# Patient Record
Sex: Female | Born: 2005 | Race: White | Hispanic: Yes | Marital: Single | Smoking: Never smoker
Health system: Southern US, Community
[De-identification: ages and names within clinical notes are randomized; demographics above are authoritative.]

## PROBLEM LIST (undated history)

## (undated) DIAGNOSIS — R740 Nonspecific elevation of levels of transaminase and lactic acid dehydrogenase [LDH]: Secondary | ICD-10-CM

## (undated) HISTORY — DX: Nonspecific elevation of levels of transaminase and lactic acid dehydrogenase (ldh): R74.0

---
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
## 2024-04-10 ENCOUNTER — Emergency Department (HOSPITAL_COMMUNITY)
Admission: EM | Admit: 2024-04-10 | Discharge: 2024-04-10 | Disposition: A | Discharge status: Home / Self Care | Attending: Emergency Medicine | Admitting: Emergency Medicine

## 2024-04-10 ENCOUNTER — Other Ambulatory Visit: Payer: Self-pay

## 2024-04-10 ENCOUNTER — Encounter (HOSPITAL_COMMUNITY): Payer: Self-pay

## 2024-04-10 DIAGNOSIS — N939 Abnormal uterine and vaginal bleeding, unspecified: Secondary | ICD-10-CM

## 2024-04-10 DIAGNOSIS — R000 Tachycardia, unspecified: Secondary | ICD-10-CM | POA: Diagnosis not present

## 2024-04-10 DIAGNOSIS — S3141XA Laceration without foreign body of vagina and vulva, initial encounter: Secondary | ICD-10-CM | POA: Insufficient documentation

## 2024-04-10 DIAGNOSIS — X58XXXA Exposure to other specified factors, initial encounter: Secondary | ICD-10-CM | POA: Insufficient documentation

## 2024-04-10 LAB — BASIC METABOLIC PANEL WITH GFR
Anion gap: 14 (ref 5–15)
BUN: 10 mg/dL (ref 6–20)
CO2: 22 mmol/L (ref 22–32)
Calcium: 9.9 mg/dL (ref 8.9–10.3)
Chloride: 103 mmol/L (ref 98–111)
Creatinine, Ser: 0.8 mg/dL (ref 0.44–1.00)
GFR, Estimated: >60 mL/min (ref >60)
Glucose, Bld: 130 mg/dL — ABNORMAL HIGH (ref 70–99)
Potassium: 3.3 mmol/L — ABNORMAL LOW (ref 3.5–5.1)
Sodium: 139 mmol/L (ref 135–145)

## 2024-04-10 LAB — CBC WITH DIFFERENTIAL/PLATELET
Abs Immature Granulocytes: 0.06 K/uL (ref 0.00–0.07)
Basophils Absolute: 0.1 K/uL (ref 0.0–0.1)
Basophils Relative: 1 %
Eosinophils Absolute: 0 K/uL (ref 0.0–0.5)
Eosinophils Relative: 0 %
HCT: 38.7 % (ref 36.0–46.0)
Hemoglobin: 12.2 g/dL (ref 12.0–15.0)
Immature Granulocytes: 1 %
Lymphocytes Relative: 19 %
Lymphs Abs: 2.2 K/uL (ref 0.7–4.0)
MCH: 25.9 pg — ABNORMAL LOW (ref 26.0–34.0)
MCHC: 31.5 g/dL (ref 30.0–36.0)
MCV: 82.2 fL (ref 80.0–100.0)
Monocytes Absolute: 0.9 K/uL (ref 0.1–1.0)
Monocytes Relative: 8 %
Neutro Abs: 8.4 K/uL — ABNORMAL HIGH (ref 1.7–7.7)
Neutrophils Relative %: 71 %
Platelets: 515 K/uL — ABNORMAL HIGH (ref 150–400)
RBC: 4.71 MIL/uL (ref 3.87–5.11)
RDW: 13.5 % (ref 11.5–15.5)
WBC: 11.6 K/uL — ABNORMAL HIGH (ref 4.0–10.5)
nRBC: 0 % (ref 0.0–0.2)

## 2024-04-10 LAB — HCG, SERUM, QUALITATIVE: Preg, Serum: NEGATIVE

## 2024-04-10 MED ORDER — SODIUM CHLORIDE 0.9 % IV BOLUS
500.0000 mL | Freq: Once | INTRAVENOUS | Status: AC
  Administered 2024-04-10: 500 mL via INTRAVENOUS

## 2024-04-10 MED ORDER — TRANEXAMIC ACID-NACL 1000-0.7 MG/100ML-% IV SOLN
1000.0000 mg | INTRAVENOUS | Status: AC
  Administered 2024-04-10: 1000 mg via INTRAVENOUS
  Filled 2024-04-10: qty 100

## 2024-04-10 MED ORDER — TRANEXAMIC ACID FOR EPISTAXIS
500.0000 mg | Freq: Once | TOPICAL | Status: DC
  Filled 2024-04-10: qty 10

## 2024-04-10 NOTE — ED Triage Notes (Signed)
 Pt reports sexual intercourse last night around 1am and has had vaginal bleeding ever since. Pt reports she was bleeding a lot when it first started and has been bleeding today as well. Pt reports having to put pressure on it  to stop the bleeding. Pt is unsure if the bleeding is internal or external. Pt reports intermittent dizziness today.

## 2024-04-10 NOTE — Discharge Instructions (Signed)
 You were seen for vaginal bleeding in the emergency department.   At home, please hold pressure because we have given you if you have recurrent small amounts of bleeding.    Check your MyChart online for the results of any tests that had not resulted by the time you left the emergency department.   Follow-up with your GYN in 2-3 days regarding your visit.    Return immediately to the emergency department if you experience any of the following: Bleeding despite pressure, lightheadedness or dizziness, or any other concerning symptoms.    Thank you for visiting our Emergency Department. It was a pleasure taking care of you today.

## 2024-04-10 NOTE — ED Notes (Signed)
 ED Provider at bedside.

## 2024-04-10 NOTE — ED Provider Notes (Signed)
 [place] EMERGENCY DEPARTMENT AT [facility] Provider Note   CSN: [id number] Arrival date & time: 04/10/24  8092     Patient presents with: Vaginal Bleeding   Kathryn Gillespie is a 18 y.o. female.  {Add pertinent medical, surgical, social history, OB history to HPI:628} 18 year old female presents emergency department vaginal bleeding.  Patient reports she was having intercourse last night when she felt severe pain and started having vaginal bleeding.  Says that she has had some dripping of blood and has applied pressure and tissues since.  Has not had to use tampons or pads.  Says the bleeding persisted so she wanted to come be evaluated.       Prior to Admission medications   Medication Sig Start Date End Date Taking? Authorizing Provider  cholecalciferol (VITAMIN D) 1000 units tablet Take 1,000 Units by mouth daily.    [provider]    Allergies: Patient has no known allergies.    Review of Systems  Updated Vital Signs BP (!) 143/72 (BP Location: Left Arm)   Pulse (!) 130   Temp 98.6 F (37 C) (Oral)   Resp 18   LMP 03/23/2024 (Approximate)   SpO2 100%   Physical Exam Constitutional:      Appearance: Normal appearance.  Cardiovascular:     Rate and Rhythm: Regular rhythm. Tachycardia present.     Pulses: Normal pulses.     Heart sounds: Normal heart sounds.  Pulmonary:     Effort: Pulmonary effort is normal.  Genitourinary:    Comments: Chaperoned by RN Maci.  External genitalia unremarkable. Nor rashes or lesions noted.  Speculum exam with small amount of blood.  Vaginal wall shows a 4cm laceration past the introitus  Cervix visualized and is unremarkable (closed in appearance without any protruding material). Neurological:     Mental Status: She is alert.     (all labs ordered are listed, but only abnormal results are displayed) Labs Reviewed  CBC WITH DIFFERENTIAL/PLATELET - Abnormal; Notable for the following  components:      Result Value   WBC 11.6 (*)    MCH 25.9 (*)    Platelets 515 (*)    Neutro Abs 8.4 (*)    All other components within normal limits  URINALYSIS, ROUTINE W REFLEX MICROSCOPIC  HCG, SERUM, QUALITATIVE  BASIC METABOLIC PANEL WITH GFR  TYPE AND SCREEN  ABO/RH    EKG: None  Radiology: No results found.  {Document cardiac monitor, telemetry assessment procedure when appropriate:32947} Procedures   Medications Ordered in the ED  tranexamic acid (CYKLOKAPRON) 1000 MG/10ML topical solution 500 mg (has no administration in time range)  sodium chloride 0.9 % bolus 500 mL (has no administration in time range)      {Click here for ABCD2, HEART and other calculators REFRESH Note before signing:1}                              Medical Decision Making Amount and/or Complexity of Data Reviewed Labs: ordered.   ***  {Document critical care time when appropriate  Document review of labs and clinical decision tools ie CHADS2VASC2, etc  Document your independent review of radiology images and any outside records  Document your discussion with family members, caretakers and with consultants  Document social determinants of health affecting pt's care  Document your decision making why or why not admission, treatments were needed:32947:::1}   Final diagnoses:  None  ED Discharge Orders     None

## 2024-04-11 LAB — TYPE AND SCREEN
ABO/RH(D): A POS
Antibody Screen: NEGATIVE
